# Patient Record
Sex: Female | Born: 1970 | Race: Black or African American | Hispanic: No | Marital: Single
Health system: Southern US, Community
[De-identification: ages and names within clinical notes are randomized; demographics above are authoritative.]

## PROBLEM LIST (undated history)

## (undated) DIAGNOSIS — I10 Essential (primary) hypertension: Secondary | ICD-10-CM

## (undated) DIAGNOSIS — N289 Disorder of kidney and ureter, unspecified: Secondary | ICD-10-CM

## (undated) HISTORY — PX: SHOULDER SURGERY: SHX246

---

## 2019-01-05 ENCOUNTER — Emergency Department (HOSPITAL_COMMUNITY)
Admission: EM | Admit: 2019-01-05 | Discharge: 2019-01-05 | Disposition: A | Discharge status: Home / Self Care | Payer: Medicaid Other | Attending: Emergency Medicine | Admitting: Emergency Medicine

## 2019-01-05 ENCOUNTER — Encounter (HOSPITAL_COMMUNITY): Payer: Self-pay

## 2019-01-05 ENCOUNTER — Emergency Department (HOSPITAL_COMMUNITY): Payer: Medicaid Other

## 2019-01-05 ENCOUNTER — Other Ambulatory Visit: Payer: Self-pay

## 2019-01-05 DIAGNOSIS — I10 Essential (primary) hypertension: Secondary | ICD-10-CM | POA: Diagnosis not present

## 2019-01-05 DIAGNOSIS — N200 Calculus of kidney: Secondary | ICD-10-CM | POA: Insufficient documentation

## 2019-01-05 DIAGNOSIS — R109 Unspecified abdominal pain: Secondary | ICD-10-CM

## 2019-01-05 DIAGNOSIS — R1031 Right lower quadrant pain: Secondary | ICD-10-CM | POA: Diagnosis present

## 2019-01-05 DIAGNOSIS — R112 Nausea with vomiting, unspecified: Secondary | ICD-10-CM

## 2019-01-05 HISTORY — DX: Essential (primary) hypertension: I10

## 2019-01-05 HISTORY — DX: Disorder of kidney and ureter, unspecified: N28.9

## 2019-01-05 LAB — COMPREHENSIVE METABOLIC PANEL
ALT: 33 U/L (ref 0–44)
AST: 24 U/L (ref 15–41)
Albumin: 3.5 g/dL (ref 3.5–5.0)
Alkaline Phosphatase: 83 U/L (ref 38–126)
Anion gap: 12 (ref 5–15)
BUN: 8 mg/dL (ref 6–20)
CO2: 24 mmol/L (ref 22–32)
Calcium: 9.2 mg/dL (ref 8.9–10.3)
Chloride: 102 mmol/L (ref 98–111)
Creatinine, Ser: 1.14 mg/dL — ABNORMAL HIGH (ref 0.44–1.00)
GFR calc Af Amer: >60 mL/min (ref >60)
GFR calc non Af Amer: 57 mL/min — ABNORMAL LOW (ref >60)
Glucose, Bld: 132 mg/dL — ABNORMAL HIGH (ref 70–99)
Potassium: 3.3 mmol/L — ABNORMAL LOW (ref 3.5–5.1)
Sodium: 138 mmol/L (ref 135–145)
Total Bilirubin: 0.7 mg/dL (ref 0.3–1.2)
Total Protein: 7.1 g/dL (ref 6.5–8.1)

## 2019-01-05 LAB — URINALYSIS, ROUTINE W REFLEX MICROSCOPIC
Bilirubin Urine: NEGATIVE
Glucose, UA: NEGATIVE mg/dL
Ketones, ur: NEGATIVE mg/dL
Leukocytes,Ua: NEGATIVE
Nitrite: NEGATIVE
PROTEIN: 30 mg/dL — AB
Specific Gravity, Urine: 1.016 (ref 1.005–1.030)
pH: 5 (ref 5.0–8.0)

## 2019-01-05 LAB — CBC WITH DIFFERENTIAL/PLATELET
Abs Immature Granulocytes: 0.12 10*3/uL — ABNORMAL HIGH (ref 0.00–0.07)
Basophils Absolute: 0 10*3/uL (ref 0.0–0.1)
Basophils Relative: 0 %
Eosinophils Absolute: 0.1 10*3/uL (ref 0.0–0.5)
Eosinophils Relative: 0 %
HCT: 48.4 % — ABNORMAL HIGH (ref 36.0–46.0)
Hemoglobin: 15.3 g/dL — ABNORMAL HIGH (ref 12.0–15.0)
IMMATURE GRANULOCYTES: 1 %
Lymphocytes Relative: 9 %
Lymphs Abs: 1.5 10*3/uL (ref 0.7–4.0)
MCH: 25.2 pg — ABNORMAL LOW (ref 26.0–34.0)
MCHC: 31.6 g/dL (ref 30.0–36.0)
MCV: 79.7 fL — AB (ref 80.0–100.0)
Monocytes Absolute: 0.6 10*3/uL (ref 0.1–1.0)
Monocytes Relative: 4 %
Neutro Abs: 13.6 10*3/uL — ABNORMAL HIGH (ref 1.7–7.7)
Neutrophils Relative %: 86 %
Platelets: 398 10*3/uL (ref 150–400)
RBC: 6.07 MIL/uL — ABNORMAL HIGH (ref 3.87–5.11)
RDW: 14.3 % (ref 11.5–15.5)
WBC: 15.9 10*3/uL — ABNORMAL HIGH (ref 4.0–10.5)
nRBC: 0 % (ref 0.0–0.2)

## 2019-01-05 LAB — I-STAT BETA HCG BLOOD, ED (MC, WL, AP ONLY): I-stat hCG, quantitative: <5 m[IU]/mL (ref <5)

## 2019-01-05 MED ORDER — CEPHALEXIN 250 MG PO CAPS
500.0000 mg | ORAL_CAPSULE | Freq: Once | ORAL | Status: AC
  Administered 2019-01-05: 500 mg via ORAL
  Filled 2019-01-05: qty 2

## 2019-01-05 MED ORDER — ONDANSETRON 4 MG PO TBDP: 4.0000 mg | ORAL_TABLET | Freq: Three times a day (TID) | ORAL | 0 refills | Status: AC | PRN

## 2019-01-05 MED ORDER — TAMSULOSIN HCL 0.4 MG PO CAPS: 0.4000 mg | ORAL_CAPSULE | Freq: Every day | ORAL | 0 refills | Status: AC

## 2019-01-05 MED ORDER — OXYCODONE-ACETAMINOPHEN 5-325 MG PO TABS
1.0000 | ORAL_TABLET | ORAL | Status: DC | PRN
  Administered 2019-01-05: 1 via ORAL
  Filled 2019-01-05: qty 1

## 2019-01-05 MED ORDER — ONDANSETRON 4 MG PO TBDP
4.0000 mg | ORAL_TABLET | Freq: Once | ORAL | Status: AC | PRN
  Administered 2019-01-05: 4 mg via ORAL
  Filled 2019-01-05: qty 1

## 2019-01-05 MED ORDER — CEPHALEXIN 500 MG PO CAPS: 500.0000 mg | ORAL_CAPSULE | Freq: Four times a day (QID) | ORAL | 0 refills | Status: AC

## 2019-01-05 NOTE — ED Triage Notes (Signed)
Pt here for continuing abd pain and nausea. Pt was diagnosed with a kidney stone on Tuesday at a hospital in [place]. Pt given ibuprofen and hydrocodone for pain but pt woke up with worsening pain this morning.

## 2019-01-05 NOTE — ED Provider Notes (Signed)
[facility] EMERGENCY DEPARTMENT Provider Note   CSN: [id number] Arrival date & time: 01/05/19  1732     History   Chief Complaint Chief Complaint  Patient presents with  . Nephrolithiasis  . Abdominal Pain    HPI Sharon Love is a 48 y.o. female with a PMHx of kidney stones and HTN, who presents to the ED with complaints of abdominal pain that began today.  Patient states that on Sunday, 6 days ago, she started having some right flank pain, nausea, and vomiting.  She was seen on Tuesday at an ER in [place] where she was diagnosed with a "small" right-sided kidney stone, she's unsure exactly where it was located or how big it was but was told it was "small".  She was sent home with Flomax, hydrocodone, and ibuprofen (left flomax at home but has others with her).  She was doing well until today when she started having some generalized abdominal pain, nausea, and vomiting.  She also had some diarrhea.  She did not take anything for her symptoms but decided to come back for repeat evaluation.  Her flank pain has been resolved, however.  She describes her pain as a 5/10 constant sharp nonradiating generalized abdominal pain that worsens with sitting up and has been improved with Percocet given here.  She reports one episode of nonbloody nonbilious emesis as well as nausea.  She states that she had 2 episodes of nonbloody diarrhea earlier today.  She denies any fevers, chills, chest pain, shortness of breath, hematemesis, constipation, melena, hematochezia, dysuria, hematuria, urinary frequency, vaginal bleeding or discharge, myalgias, arthralgias, numbness, tingling, focal weakness, or any other complaints at this time.  She denies any recent foreign travel, sick contacts, suspicious food intake, alcohol use, or NSAID use.  The history is provided by the patient and medical records. No language interpreter was used.  Abdominal Pain  Associated symptoms: diarrhea, nausea and  vomiting   Associated symptoms: no chest pain, no chills, no constipation, no dysuria, no fever, no hematuria, no shortness of breath, no vaginal bleeding and no vaginal discharge     Past Medical History:  Diagnosis Date  . Hypertension   . Renal disorder    kidney stones    There are no active problems to display for this patient.   Past Surgical History:  Procedure Laterality Date  . CESAREAN SECTION     3 times  . SHOULDER SURGERY       OB History   No obstetric history on file.      Home Medications    Prior to Admission medications   Not on File    Family History No family history on file.  Social History Social History   Tobacco Use  . Smoking status: Not on file  Substance Use Topics  . Alcohol use: Not on file  . Drug use: Not on file     Allergies   Patient has no allergy information on record.   Review of Systems Review of Systems  Constitutional: Negative for chills and fever.  Respiratory: Negative for shortness of breath.   Cardiovascular: Negative for chest pain.  Gastrointestinal: Positive for abdominal pain, diarrhea, nausea and vomiting. Negative for blood in stool and constipation.  Genitourinary: Negative for dysuria, flank pain (resolved), frequency, hematuria, vaginal bleeding and vaginal discharge.  Musculoskeletal: Negative for arthralgias and myalgias.  Skin: Negative for color change.  Allergic/Immunologic: Negative for immunocompromised state.  Neurological: Negative for weakness and numbness.  Psychiatric/Behavioral: Negative for confusion.   All other systems reviewed and are negative for acute change except as noted in the HPI.    Physical Exam Updated Vital Signs BP 100/75   Pulse 91   Temp 98.5 F (36.9 C) (Oral)   Resp 18   SpO2 100%   Physical Exam Vitals signs and nursing note reviewed.  Constitutional:      General: She is not in acute distress.    Appearance: Normal appearance. She is well-developed.  She is not toxic-appearing.     Comments: Afebrile, nontoxic, NAD  HENT:     Head: Normocephalic and atraumatic.  Eyes:     General:        Right eye: No discharge.        Left eye: No discharge.     Conjunctiva/sclera: Conjunctivae normal.  Neck:     Musculoskeletal: Normal range of motion and neck supple.  Cardiovascular:     Rate and Rhythm: Normal rate and regular rhythm.     Pulses: Normal pulses.     Heart sounds: Normal heart sounds, S1 normal and S2 normal. No murmur. No friction rub. No gallop.   Pulmonary:     Effort: Pulmonary effort is normal. No respiratory distress.     Breath sounds: Normal breath sounds. No decreased breath sounds, wheezing, rhonchi or rales.  Abdominal:     General: Bowel sounds are normal. There is no distension.     Palpations: Abdomen is soft. Abdomen is not rigid.     Tenderness: There is abdominal tenderness in the right upper quadrant and right lower quadrant. There is no right CVA tenderness, left CVA tenderness, guarding or rebound. Negative signs include Murphy's sign and McBurney's sign.     Comments: Soft, nondistended, +BS throughout, with mild R lateral abd TTP, no r/g/r, neg murphy's, neg mcburney's, no CVA TTP   Musculoskeletal: Normal range of motion.  Skin:    General: Skin is warm and dry.     Findings: No rash.  Neurological:     Mental Status: She is alert and oriented to person, place, and time.     Sensory: Sensation is intact. No sensory deficit.     Motor: Motor function is intact.  Psychiatric:        Mood and Affect: Mood and affect normal.        Behavior: Behavior normal.      ED Treatments / Results  Labs (all labs ordered are listed, but only abnormal results are displayed) Labs Reviewed  CBC WITH DIFFERENTIAL/PLATELET - Abnormal; Notable for the following components:      Result Value   WBC 15.9 (*)    RBC 6.07 (*)    Hemoglobin 15.3 (*)    HCT 48.4 (*)    MCV 79.7 (*)    MCH 25.2 (*)    Neutro Abs 13.6  (*)    Abs Immature Granulocytes 0.12 (*)    All other components within normal limits  COMPREHENSIVE METABOLIC PANEL - Abnormal; Notable for the following components:   Potassium 3.3 (*)    Glucose, Bld 132 (*)    Creatinine, Ser 1.14 (*)    GFR calc non Af Amer 57 (*)    All other components within normal limits  URINALYSIS, ROUTINE W REFLEX MICROSCOPIC  I-STAT BETA HCG BLOOD, ED ([facility], [facility], [facility] ONLY)    EKG None  Radiology Dg Abdomen 1 View  Result Date: 01/05/2019 CLINICAL DATA:  Abdominal pain and nausea.  Kidney stone. EXAM: ABDOMEN - 1 VIEW COMPARISON:  None. FINDINGS: Two calcifications in the pelvis are identified. There is a paucity of bowel gas but no evidence of obstruction. No renal stones noted. No other acute abnormalities. IMPRESSION: 1. The paucity of bowel gas limits evaluation but no obstruction is identified. 2. Two calcifications in the pelvis are nonspecific but may be phleboliths. If there is concern for ureteral stones, CT imaging could better evaluate. 3. No other abnormalities. Electronically Signed   By: Gerome Sam III M.D   On: 01/05/2019 20:15    Procedures Procedures (including critical care time)  Medications Ordered in ED Medications  oxyCODONE-acetaminophen (PERCOCET/ROXICET) 5-325 MG per tablet 1 tablet (1 tablet Oral Given 01/05/19 1748)  ondansetron (ZOFRAN-ODT) disintegrating tablet 4 mg (4 mg Oral Given 01/05/19 1747)     Initial Impression / Assessment and Plan / ED Course  I have reviewed the triage vital signs and the nursing notes.  Pertinent labs & imaging results that were available during my care of the patient were reviewed by me and considered in my medical decision making (see chart for details).     48 y.o. female here with abdominal pain that began today, recent diagnosis of right-sided kidney stone.  She had not taken anything prior to arrival but was given Percocet and Zofran on arrival and states that this has helped.  On  exam, mild right lateral abdominal tenderness, non-peritoneal, negative McBurney's point, negative Murphy's.  Suspect kidney stone has moved, is likely passing; doubt appendicitis, cholecystitis, or other concerning etiology.  Will obtain basic labs, urinalysis, and KUB.  Will reassess shortly. Discussed case with my attending Dr. Charm Barges who agrees with plan.   9:57 PM CBC w/diff with mildly elevated WBC 15.9 but appears hemoconcentrated with Hgb 15.3, likely hemoconcentration and stress demargination contributing to WBC elevation. CMP with marginally elevated Cr 1.14, unclear baseline, and marginally low K 3.3 but doubt need for repletion. BetaHCG neg. KUB with two calcifications in pelvis which could be pheboliths, measuring ~3-22mm on my review of the images; one on the right side and one on the left side. If these are kidney stones, then they are small enough to pass on their own. Suspect her pain is due to moving kidney stones that are likely trying to pass. Pt feeling better and tolerating PO well.   Has not yet urinated, is trying to drink fluids to see if she can encourage some urine to come out; she doesn't want an IV with fluids. At this point, if U/A is unremarkable, then I feel she could go home, Pt has pain meds already at home, would send home with zofran and flomax rx's, advised use of home meds and f/up with urology in 1wk for recheck. Strict return precautions advised. If urine shows evidence of UTI, then may need to reconsider the plan. Patient care to be resumed by Dr. Charm Barges at shift change sign-out pending U/A. Patient history has been discussed with provider resuming care. Please see their notes for further documentation of pending results and dispo/care. Pt stable at sign-out and updated on transfer of care.    Final Clinical Impressions(s) / ED Diagnoses   Final diagnoses:  Right sided abdominal pain  Nephrolithiasis  Nausea and vomiting in adult patient    ED Discharge Orders          Ordered    tamsulosin (FLOMAX) 0.4 MG CAPS capsule  Daily after supper     01/05/19 2155  ondansetron (ZOFRAN ODT) 4 MG disintegrating tablet  Every 8 hours PRN     01/05/19 [address], [place], [place] 01/05/19 2157    Terrilee Files, MD 01/08/19 2340

## 2019-01-05 NOTE — ED Notes (Signed)
Patient verbalizes understanding of discharge instructions. Opportunity for questioning and answers were provided. Armband removed by staff, pt discharged from ED in wheelchair.  

## 2019-01-05 NOTE — Discharge Instructions (Addendum)
Take home ibuprofen as directed as needed for pain using home hydrocodone for breakthrough pain. Do not drive or operate machinery with pain medication use. May need over-the-counter stool softener with this pain medication use. Use Zofran as needed for nausea. Use Flomax as directed, as this medication will help you pass the stone. Strain all urine to try to catch the stone when it passes. Follow-up with the urologist in the next 1 to 2 weeks for recheck of ongoing pain, however for intractable or uncontrollable symptoms at home then go to the [facility] emergency department.

## 2019-01-07 LAB — URINE CULTURE: Culture: <10000 — AB

## 2019-11-04 IMAGING — DX DG ABDOMEN 1V
2 series · 2 of 2 positions shown · non-contrast
Comparison: None.

CLINICAL DATA: Abdominal pain and nausea.  Kidney stone.

EXAM:
ABDOMEN - 1 VIEW

[abdomen kub (1 of 2)]
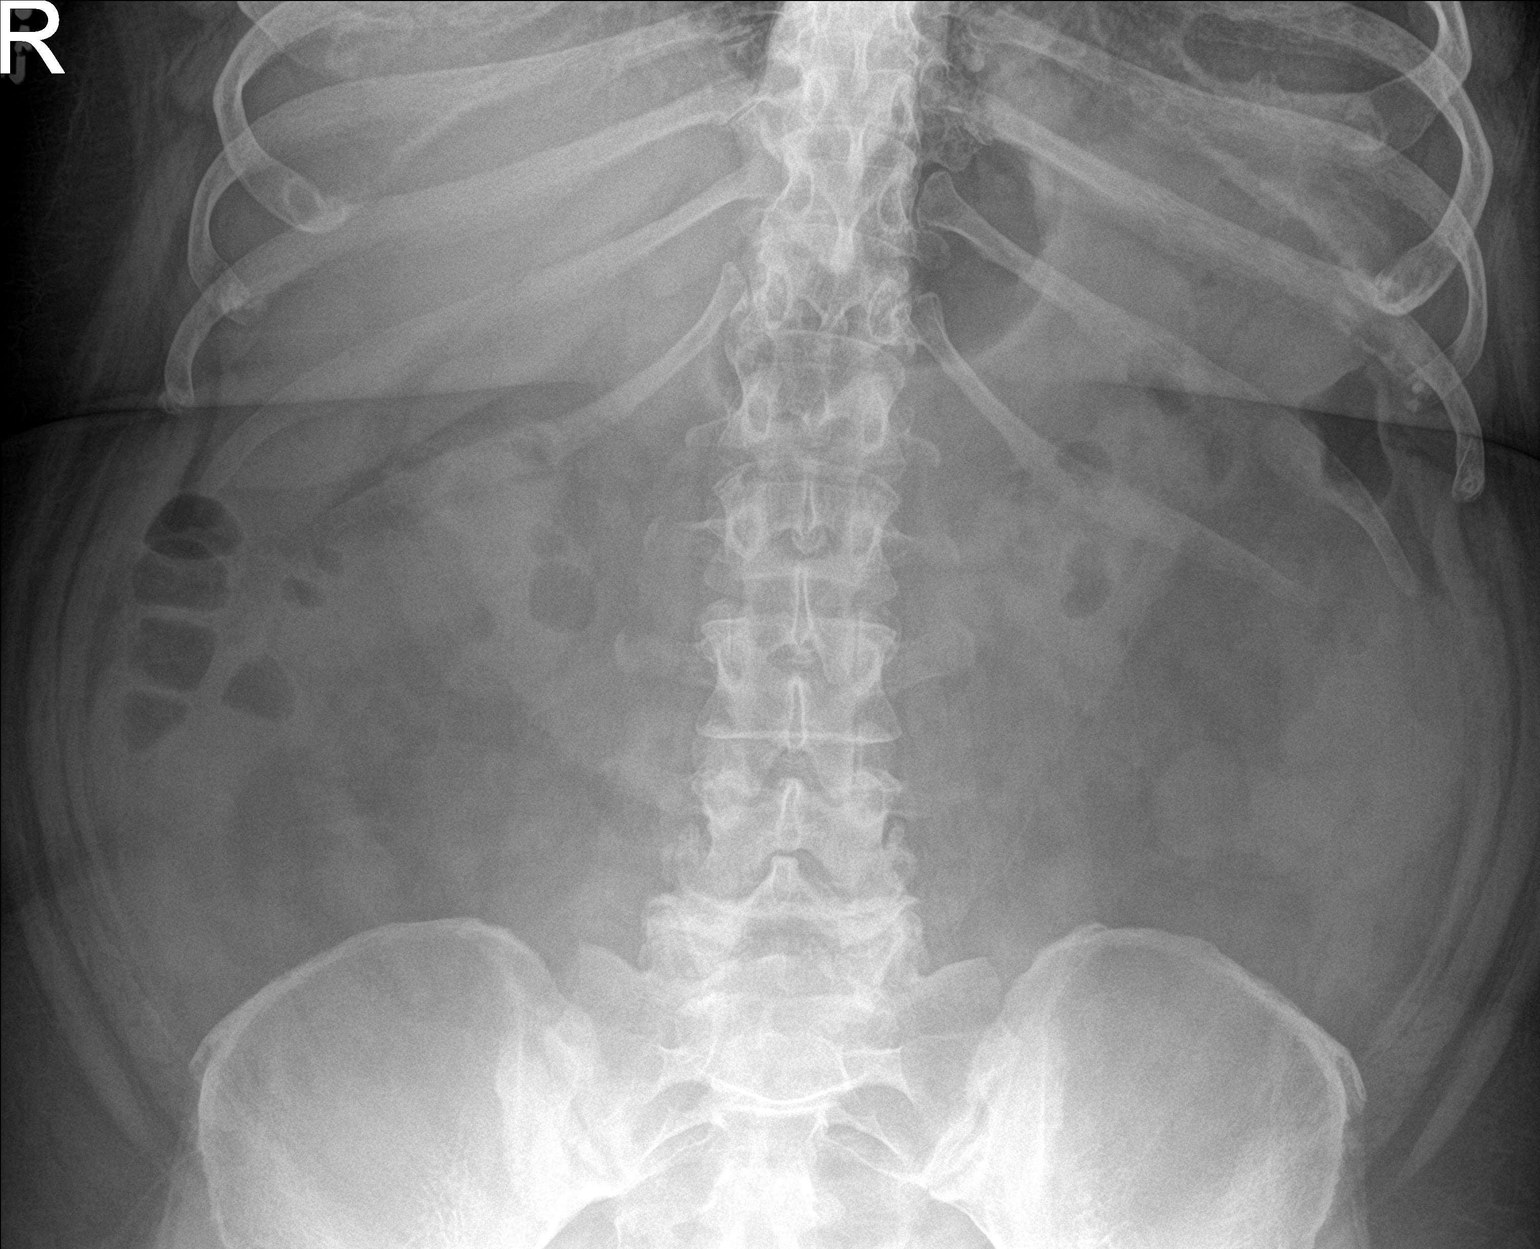

[abdomen kub (2 of 2)]
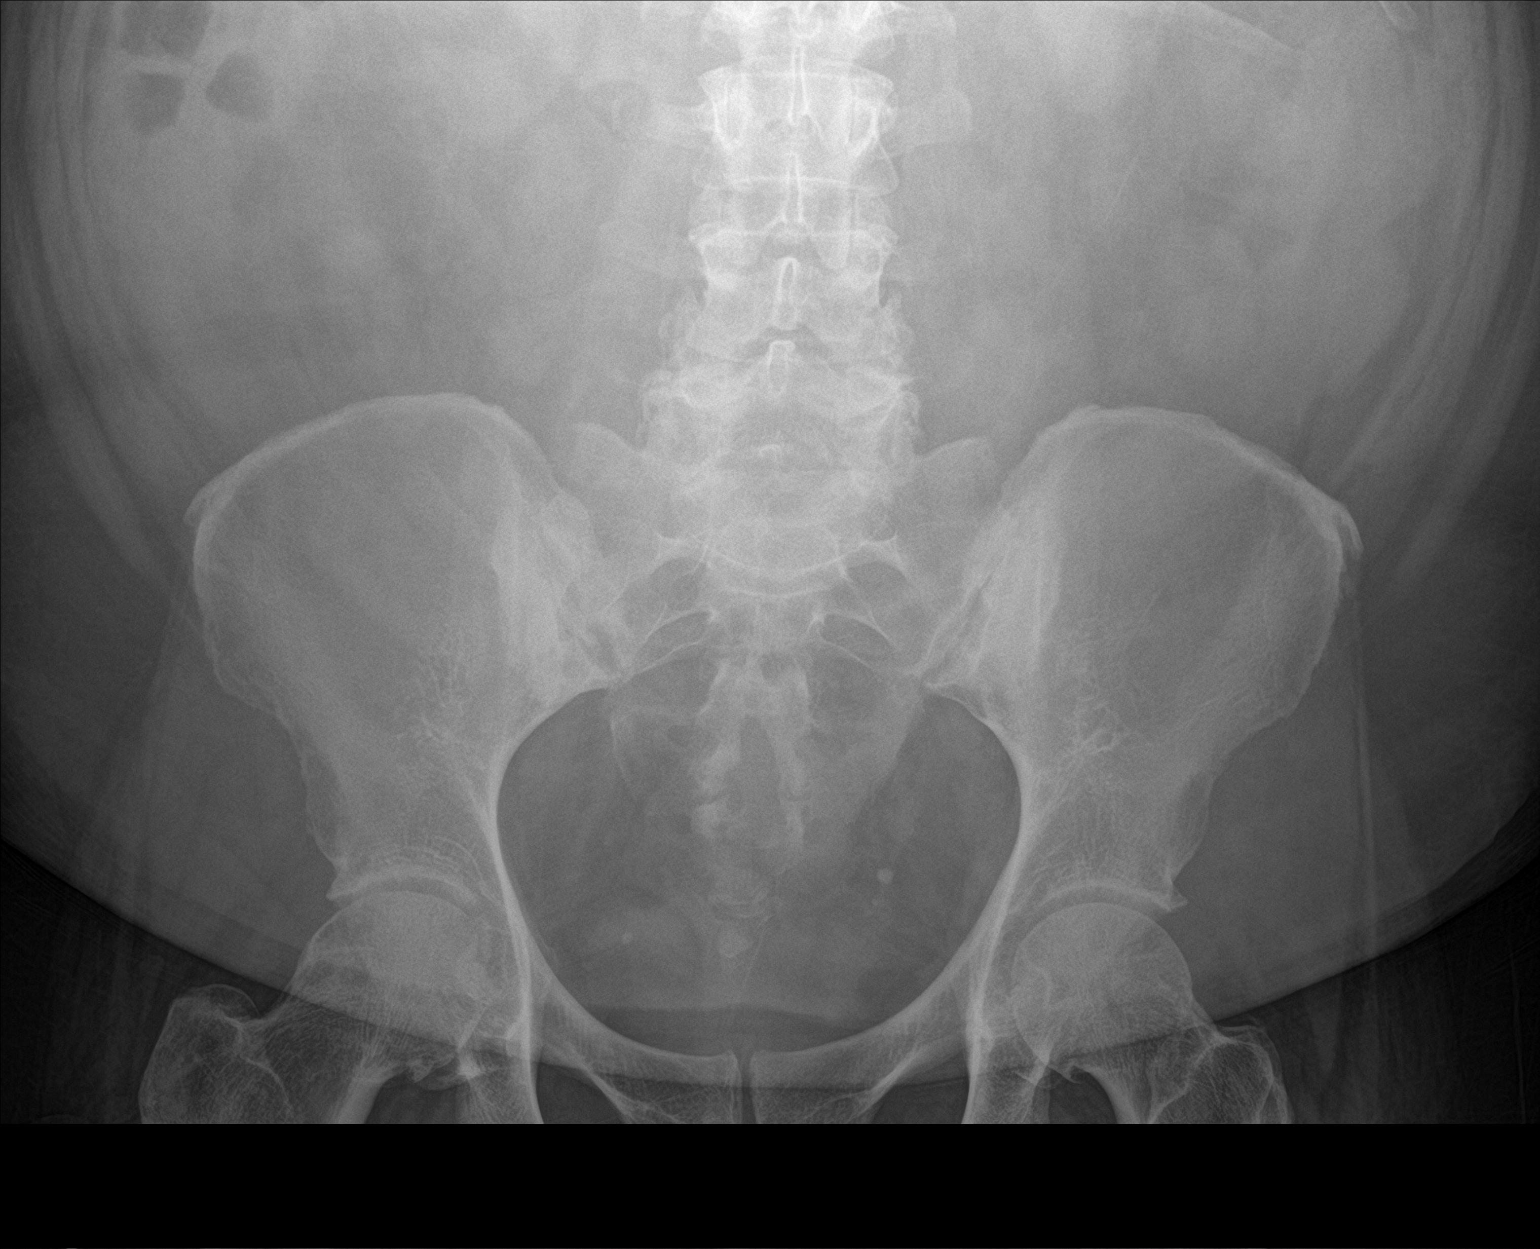

[2 of 2 positions shown; findings below may reference images not displayed]

FINDINGS: Two calcifications in the pelvis are identified. There is a paucity
of bowel gas but no evidence of obstruction. No renal stones noted.
No other acute abnormalities.
IMPRESSION: 1. The paucity of bowel gas limits evaluation but no obstruction is
identified.
2. Two calcifications in the pelvis are nonspecific but may be
phleboliths. If there is concern for ureteral stones, CT imaging
could better evaluate.
3. No other abnormalities.
